# Patient Record
Sex: Male | Born: 1995 | Race: White | Hispanic: No | Marital: Single | Smoking: Current every day smoker
Health system: Southern US, Community
[De-identification: ages and names within clinical notes are randomized; demographics above are authoritative.]

---
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
## 2019-02-02 ENCOUNTER — Encounter: Payer: Self-pay | Admitting: Emergency Medicine

## 2019-02-02 ENCOUNTER — Ambulatory Visit
Admission: EM | Admit: 2019-02-02 | Discharge: 2019-02-02 | Disposition: A | Discharge status: Home / Self Care | Payer: BC Managed Care – PPO | Attending: Family Medicine | Admitting: Family Medicine

## 2019-02-02 DIAGNOSIS — J209 Acute bronchitis, unspecified: Secondary | ICD-10-CM | POA: Diagnosis not present

## 2019-02-02 MED ORDER — ALBUTEROL SULFATE HFA 108 (90 BASE) MCG/ACT IN AERS: 1.0000 | INHALATION_SPRAY | Freq: Four times a day (QID) | RESPIRATORY_TRACT | 0 refills | Status: AC | PRN

## 2019-02-02 MED ORDER — IBUPROFEN 600 MG PO TABS: 600.0000 mg | ORAL_TABLET | Freq: Four times a day (QID) | ORAL | 0 refills | Status: AC | PRN

## 2019-02-02 MED ORDER — CETIRIZINE HCL 10 MG PO CAPS: 10.0000 mg | ORAL_CAPSULE | Freq: Every day | ORAL | 0 refills | Status: AC

## 2019-02-02 MED ORDER — PREDNISONE 50 MG PO TABS: ORAL_TABLET | ORAL | 0 refills | Status: AC

## 2019-02-02 MED ORDER — PSEUDOEPH-BROMPHEN-DM 30-2-10 MG/5ML PO SYRP: 5.0000 mL | ORAL_SOLUTION | Freq: Four times a day (QID) | ORAL | 0 refills | Status: AC | PRN

## 2019-02-02 NOTE — ED Provider Notes (Signed)
[facility]    CSN: [id number] Arrival date & time: 02/02/19  1335     History   Chief Complaint Chief Complaint  Patient presents with  . flu-Like Symptoms    HPI Marc Mitchell is a 23 y.o. male history of tobacco use presenting today for evaluation of URI symptoms.  Patient states that he has had cough, congestion, headaches, body aches as well as hot and cold chills.  Symptoms have been going on for approximately 4 days.  He is also had sore throat and some mild rhinorrhea.  He is tried TheraFlu over-the-counter with mild relief.  He notes that he smokes approximately 1 pack a day.  Denies history of asthma.  Denies fevers.  HPI  History reviewed. No pertinent past medical history.  There are no active problems to display for this patient.   History reviewed. No pertinent surgical history.     Home Medications    Prior to Admission medications   Medication Sig Start Date End Date Taking? Authorizing Provider  albuterol (PROVENTIL HFA;VENTOLIN HFA) 108 (90 Base) MCG/ACT inhaler Inhale 1-2 puffs into the lungs every 6 (six) hours as needed for wheezing or shortness of breath. 02/02/19   Ethaniel Garfield C, PA-C  brompheniramine-pseudoephedrine-DM 30-2-10 MG/5ML syrup Take 5 mLs by mouth 4 (four) times daily as needed. 02/02/19   Azalee Weimer C, PA-C  Cetirizine HCl 10 MG CAPS Take 1 capsule (10 mg total) by mouth daily for 10 days. 02/02/19 02/12/19  Aryon Nham C, PA-C  predniSONE (DELTASONE) 50 MG tablet Please take prednisone daily for the next 5 days, please take with food 02/02/19   Jahari Wiginton, Junius Creamer, PA-C    Family History History reviewed. No pertinent family history.  Social History Social History   Tobacco Use  . Smoking status: Current Every Day Smoker    Packs/day: 1.00  . Smokeless tobacco: Never Used  Substance Use Topics  . Alcohol use: Not Currently  . Drug use: Never     Allergies   Patient has no known allergies.   Review of  Systems Review of Systems  Constitutional: Positive for chills. Negative for activity change, appetite change, fatigue and fever.  HENT: Positive for congestion, rhinorrhea and sore throat. Negative for ear pain, sinus pressure and trouble swallowing.   Eyes: Negative for discharge and redness.  Respiratory: Positive for cough. Negative for chest tightness and shortness of breath.   Cardiovascular: Negative for chest pain.  Gastrointestinal: Negative for abdominal pain, diarrhea, nausea and vomiting.  Musculoskeletal: Negative for myalgias.  Skin: Negative for rash.  Neurological: Negative for dizziness, light-headedness and headaches.     Physical Exam Triage Vital Signs ED Triage Vitals  Enc Vitals Group     BP 02/02/19 1344 125/73     Pulse Rate 02/02/19 1344 83     Resp 02/02/19 1344 18     Temp 02/02/19 1344 97.8 F (36.6 C)     Temp Source 02/02/19 1344 Oral     SpO2 02/02/19 1344 95 %     Weight --      Height --      Head Circumference --      Peak Flow --      Pain Score 02/02/19 1345 6     Pain Loc --      Pain Edu? --      Excl. in GC? --    No data found.  Updated Vital Signs BP 125/73 (BP Location: Left Arm)  Pulse 83   Temp 97.8 F (36.6 C) (Oral)   Resp 18   SpO2 95%   Visual Acuity Right Eye Distance:   Left Eye Distance:   Bilateral Distance:    Right Eye Near:   Left Eye Near:    Bilateral Near:     Physical Exam Vitals signs and nursing note reviewed.  Constitutional:      Appearance: He is well-developed.  HENT:     Head: Normocephalic and atraumatic.     Ears:     Comments: Bilateral ears without tenderness to palpation of external auricle, tragus and mastoid, EAC's without erythema or swelling, TM's with good bony landmarks and cone of light. Non erythematous.     Nose:     Comments: Mildly erythematous, nonswollen turbinates, no rhinorrhea    Mouth/Throat:     Comments: Oral mucosa pink and moist, no tonsillar enlargement or  exudate. Posterior pharynx patent and nonerythematous, no uvula deviation or swelling. Normal phonation.  Eyes:     Conjunctiva/sclera: Conjunctivae normal.  Neck:     Musculoskeletal: Neck supple.  Cardiovascular:     Rate and Rhythm: Normal rate and regular rhythm.     Heart sounds: No murmur.  Pulmonary:     Effort: Pulmonary effort is normal. No respiratory distress.     Breath sounds: Wheezing and rhonchi present.     Comments: Inspiratory and expiratory wheezing and rhonchi throughout bilateral lung fields Abdominal:     Palpations: Abdomen is soft.     Tenderness: There is no abdominal tenderness.  Skin:    General: Skin is warm and dry.  Neurological:     Mental Status: He is alert.      UC Treatments / Results  Labs (all labs ordered are listed, but only abnormal results are displayed) Labs Reviewed - No data to display  EKG None  Radiology No results found.  Procedures Procedures (including critical care time)  Medications Ordered in UC Medications - No data to display  Initial Impression / Assessment and Plan / UC Course  I have reviewed the triage vital signs and the nursing notes.  Pertinent labs & imaging results that were available during my care of the patient were reviewed by me and considered in my medical decision making (see chart for details).     Patient with bronchitis, offered DuoNeb/breathing treatment in clinic, patient declined as concern for cost.  Will discharge home with course of prednisone, albuterol inhaler.  Daily allergy pill as well as cough syrup and further symptomatic and supportive care recommended for congestion and cough.  Continue to monitor,Discussed strict return precautions. Patient verbalized understanding and is agreeable with plan.  Final Clinical Impressions(s) / UC Diagnoses   Final diagnoses:  Acute bronchitis, unspecified organism     Discharge Instructions     I am treating you for bronchitis Begin  prednisone daily for the next 5 days-should help decrease inflammation and help with wheezing Please use albuterol inhaler as needed for shortness of breath and wheezing Begin daily cetirizine/Zyrtec or Claritin to help with congestion and drainage that she may get this over-the-counter if it is cheaper You may use cough syrup provided as needed for cough and congestion as well which has some Sudafed aw well For further congestion management you may try Mucinex over-the-counter For further cough management you may try Delsym or Robitussin-DM over-the-counter  Please follow-up if symptoms not resolving, worsening, developing shortness of breath, difficulty breathing, chest discomfort or fevers  ED Prescriptions    Medication Sig Dispense Auth. Provider   predniSONE (DELTASONE) 50 MG tablet Please take prednisone daily for the next 5 days, please take with food 5 tablet Britt Petroni C, PA-C   albuterol (PROVENTIL HFA;VENTOLIN HFA) 108 (90 Base) MCG/ACT inhaler Inhale 1-2 puffs into the lungs every 6 (six) hours as needed for wheezing or shortness of breath. 1 Inhaler Letina Luckett C, PA-C   brompheniramine-pseudoephedrine-DM 30-2-10 MG/5ML syrup Take 5 mLs by mouth 4 (four) times daily as needed. 120 mL Greg Eckrich C, PA-C   Cetirizine HCl 10 MG CAPS Take 1 capsule (10 mg total) by mouth daily for 10 days. 10 capsule Nikiesha Milford C, PA-C     Controlled Substance Prescriptions [place] Controlled Substance Registry consulted? Not Applicable   Lew Dawes, [place] 02/02/19 1401

## 2019-02-02 NOTE — ED Notes (Signed)
Patient able to ambulate independently  

## 2019-02-02 NOTE — ED Triage Notes (Signed)
Pt presents to [facility] for assessment of hot and cold flashes, body aches, headache, congestion, and cough x 4 days

## 2019-02-02 NOTE — Discharge Instructions (Signed)
I am treating you for bronchitis Begin prednisone daily for the next 5 days-should help decrease inflammation and help with wheezing Please use albuterol inhaler as needed for shortness of breath and wheezing Begin daily cetirizine/Zyrtec or Claritin to help with congestion and drainage that she may get this over-the-counter if it is cheaper You may use cough syrup provided as needed for cough and congestion as well which has some Sudafed aw well For further congestion management you may try Mucinex over-the-counter For further cough management you may try Delsym or Robitussin-DM over-the-counter  Please follow-up if symptoms not resolving, worsening, developing shortness of breath, difficulty breathing, chest discomfort or fevers
